# Patient Record
Sex: Male | Born: 1955 | Race: Black or African American | Hispanic: No | Marital: Married | Smoking: Never smoker
Health system: Southern US, Community
[De-identification: ages and names within clinical notes are randomized; demographics above are authoritative.]

## PROBLEM LIST (undated history)

## (undated) DIAGNOSIS — E785 Hyperlipidemia, unspecified: Secondary | ICD-10-CM

## (undated) DIAGNOSIS — E119 Type 2 diabetes mellitus without complications: Secondary | ICD-10-CM

## (undated) DIAGNOSIS — N189 Chronic kidney disease, unspecified: Secondary | ICD-10-CM

## (undated) DIAGNOSIS — I10 Essential (primary) hypertension: Secondary | ICD-10-CM

## (undated) HISTORY — DX: Chronic kidney disease, unspecified: N18.9

## (undated) HISTORY — DX: Essential (primary) hypertension: I10

## (undated) HISTORY — PX: COLONOSCOPY: SHX174

## (undated) HISTORY — PX: SEPTOPLASTY: SUR1290

## (undated) HISTORY — DX: Hyperlipidemia, unspecified: E78.5

---
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
## 2004-06-09 ENCOUNTER — Emergency Department (HOSPITAL_COMMUNITY): Admission: EM | Admit: 2004-06-09 | Discharge: 2004-06-10 | Payer: Self-pay | Admitting: Emergency Medicine

## 2005-03-31 ENCOUNTER — Encounter: Admission: RE | Admit: 2005-03-31 | Discharge: 2005-03-31 | Payer: Self-pay | Admitting: Emergency Medicine

## 2005-07-13 ENCOUNTER — Encounter: Admission: RE | Admit: 2005-07-13 | Discharge: 2005-07-13 | Payer: Self-pay | Admitting: Emergency Medicine

## 2005-08-19 ENCOUNTER — Encounter: Admission: RE | Admit: 2005-08-19 | Discharge: 2005-08-19 | Payer: Self-pay | Admitting: Emergency Medicine

## 2010-05-05 ENCOUNTER — Ambulatory Visit: Payer: Self-pay | Admitting: Cardiovascular Disease

## 2010-10-18 ENCOUNTER — Other Ambulatory Visit: Payer: Self-pay | Admitting: *Deleted

## 2010-10-18 DIAGNOSIS — E7800 Pure hypercholesterolemia, unspecified: Secondary | ICD-10-CM

## 2010-10-28 ENCOUNTER — Encounter: Payer: Self-pay | Admitting: Cardiovascular Disease

## 2010-10-28 DIAGNOSIS — E785 Hyperlipidemia, unspecified: Secondary | ICD-10-CM | POA: Insufficient documentation

## 2010-10-28 DIAGNOSIS — I10 Essential (primary) hypertension: Secondary | ICD-10-CM | POA: Insufficient documentation

## 2010-11-03 ENCOUNTER — Encounter: Payer: Self-pay | Admitting: Cardiovascular Disease

## 2010-11-03 ENCOUNTER — Other Ambulatory Visit (INDEPENDENT_AMBULATORY_CARE_PROVIDER_SITE_OTHER): Payer: BC Managed Care – PPO | Admitting: *Deleted

## 2010-11-03 ENCOUNTER — Ambulatory Visit (INDEPENDENT_AMBULATORY_CARE_PROVIDER_SITE_OTHER): Payer: BC Managed Care – PPO | Admitting: Cardiovascular Disease

## 2010-11-03 VITALS — BP 128/84 | HR 80 | Wt 229.0 lb

## 2010-11-03 DIAGNOSIS — E785 Hyperlipidemia, unspecified: Secondary | ICD-10-CM

## 2010-11-03 DIAGNOSIS — I10 Essential (primary) hypertension: Secondary | ICD-10-CM

## 2010-11-03 DIAGNOSIS — E7800 Pure hypercholesterolemia, unspecified: Secondary | ICD-10-CM

## 2010-11-03 LAB — HEPATIC FUNCTION PANEL
ALT: 38 U/L (ref 0–53)
Alkaline Phosphatase: 40 U/L (ref 39–117)
Bilirubin, Direct: 0.1 mg/dL (ref 0.0–0.3)
Total Bilirubin: 0.5 mg/dL (ref 0.3–1.2)
Total Protein: 6.7 g/dL (ref 6.0–8.3)

## 2010-11-03 LAB — BASIC METABOLIC PANEL
BUN: 15 mg/dL (ref 6–23)
CO2: 27 mEq/L (ref 19–32)
Calcium: 9.8 mg/dL (ref 8.4–10.5)
Chloride: 101 mEq/L (ref 96–112)
Creatinine, Ser: 1.4 mg/dL (ref 0.4–1.5)
GFR: 68.74 mL/min (ref >60.00)
Glucose, Bld: 140 mg/dL — ABNORMAL HIGH (ref 70–99)
Potassium: 4.6 mEq/L (ref 3.5–5.1)
Sodium: 135 mEq/L (ref 135–145)

## 2010-11-03 LAB — LIPID PANEL
Cholesterol: 117 mg/dL (ref 0–200)
HDL: 44.9 mg/dL
LDL Cholesterol: 52 mg/dL (ref 0–99)
Total CHOL/HDL Ratio: 3
Triglycerides: 103 mg/dL (ref 0.0–149.0)
VLDL: 20.6 mg/dL (ref 0.0–40.0)

## 2010-11-03 NOTE — Assessment & Plan Note (Signed)
His blood pressure is fairly well-controlled. Continue with his same medications. I've encouraged him to exercise and to try to lose weight.

## 2010-11-03 NOTE — Progress Notes (Signed)
Baird Cancer Date of Birth  1955-09-07 [facility] / [facility] [address]     [address] [place], [place]  [address] [phone]  Fax  [phone]  History of Present Illness:  Cole Travis is a middle aged man with a history of hypertension.  He has done very well recently.  He has put on a bit of weight over the past several months.  He denies any chest pain or dyspnea.    Current Outpatient Prescriptions on File Prior to Visit  Medication Sig Dispense Refill  . amLODipine (NORVASC) 10 MG tablet Take 10 mg by mouth daily.        Marland Kitchen aspirin 81 MG tablet Take 81 mg by mouth daily.        Marland Kitchen atorvastatin (LIPITOR) 20 MG tablet Take 20 mg by mouth daily.        . carvedilol (COREG) 25 MG tablet Take 25 mg by mouth 2 (two) times daily with a meal.        . Cetirizine HCl (ZYRTEC PO) Take by mouth as needed.        . Cholecalciferol (VITAMIN D) 1000 UNITS capsule Take 1,000 Units by mouth 2 (two) times daily.       Marland Kitchen doxazosin (CARDURA) 4 MG tablet Take 4 mg by mouth at bedtime.        Marland Kitchen losartan (COZAAR) 100 MG tablet Take 100 mg by mouth daily.        . metFORMIN (GLUCOPHAGE) 500 MG tablet Take 500 mg by mouth 2 (two) times daily with a meal.        . Multiple Vitamin (MULTIVITAMIN) tablet Take 1 tablet by mouth daily.        Marland Kitchen spironolactone-hydrochlorothiazide (ALDACTAZIDE) 25-25 MG per tablet Take 1 tablet by mouth daily.        . meclizine (ANTIVERT) 25 MG tablet Take 25 mg by mouth 3 (three) times daily as needed.        Marland Kitchen DISCONTD: potassium chloride 20 MEQ/15ML (10%) solution Take 20 mEq by mouth 3 (three) times daily.         Allergies  Allergen Reactions  . Lotrel   . Tetracyclines & Related     Past Medical History  Diagnosis Date  . Hyperlipidemia   . Hypertension     Past Surgical History  Procedure Date  . Breast lumpectomy 1976    LEFT BREAST    History  Smoking status  . Never Smoker   Smokeless tobacco  . Not on file     History  Alcohol Use No    Family History  Problem Relation Age of Onset  . Hypertension Mother   . Hypertension Father     Reviw of Systems:  Reviewed in the HPI.  All other systems are negative.  Physical Exam: BP 128/84  Pulse 80  Wt 229 lb (103.874 kg) The patient is alert and oriented x 3.  The mood and affect are normal.  The skin is warm and dry.  Color is normal.  The HEENT exam reveals that the sclera are nonicteric.  The mucous membranes are moist.  The carotids are 2+ without bruits.  There is no thyromegaly.  There is no JVD.  The lungs are clear.  The chest wall is non tender.  The heart exam reveals a regular rate with a normal S1 and S2.  There are no murmurs, gallops, or rubs.  The PMI is not displaced.  Abdominal exam reveals good bowel sounds.  There is no guarding or rebound.  There is no hepatosplenomegaly or tenderness.  There are no masses.  Exam of the legs reveal no clubbing, cyanosis, or edema.  The legs are without rashes.  The distal pulses are intact.  Cranial nerves II - XII are intact.  Motor and sensory functions are intact.  The gait is normal.  ECG: NSR.  No ST or T wave changes.  Assessment / Plan:

## 2010-11-05 ENCOUNTER — Telehealth: Payer: Self-pay | Admitting: *Deleted

## 2010-11-05 NOTE — Telephone Encounter (Signed)
Message copied by Mahalia Longest on Fri Nov 05, 2010  3:29 PM ------      Message from: [place], [place]      Created: Thu Nov 04, 2010  1:43 PM       Looks good

## 2010-11-05 NOTE — Telephone Encounter (Signed)
Patient called with lab results, normal result  msg left. Alfonso Ramus RN

## 2011-01-13 ENCOUNTER — Other Ambulatory Visit: Payer: Self-pay | Admitting: Cardiovascular Disease

## 2011-05-05 ENCOUNTER — Other Ambulatory Visit: Payer: Self-pay | Admitting: Emergency Medicine

## 2011-05-05 DIAGNOSIS — N63 Unspecified lump in unspecified breast: Secondary | ICD-10-CM

## 2011-05-23 ENCOUNTER — Ambulatory Visit
Admission: RE | Admit: 2011-05-23 | Discharge: 2011-05-23 | Disposition: A | Discharge status: Home / Self Care | Payer: BC Managed Care – PPO | Source: Ambulatory Visit | Attending: Emergency Medicine | Admitting: Emergency Medicine

## 2011-05-23 DIAGNOSIS — N63 Unspecified lump in unspecified breast: Secondary | ICD-10-CM

## 2011-05-31 ENCOUNTER — Other Ambulatory Visit: Payer: Self-pay | Admitting: Cardiovascular Disease

## 2011-07-21 ENCOUNTER — Other Ambulatory Visit: Payer: Self-pay | Admitting: *Deleted

## 2011-07-21 MED ORDER — LOSARTAN POTASSIUM 100 MG PO TABS: 100.0000 mg | ORAL_TABLET | Freq: Every day | ORAL | Status: DC

## 2011-07-21 NOTE — Telephone Encounter (Signed)
Fax Received. Refill Completed. Cole Travis (R.M.A)   

## 2011-11-24 ENCOUNTER — Other Ambulatory Visit: Payer: Self-pay | Admitting: Cardiovascular Disease

## 2011-11-24 MED ORDER — LOSARTAN POTASSIUM 100 MG PO TABS: 100.0000 mg | ORAL_TABLET | Freq: Every day | ORAL | Status: AC

## 2012-01-23 ENCOUNTER — Other Ambulatory Visit: Payer: Self-pay | Admitting: Cardiovascular Disease

## 2012-01-23 MED ORDER — ATORVASTATIN CALCIUM 20 MG PO TABS: 20.0000 mg | ORAL_TABLET | Freq: Every day | ORAL | Status: DC

## 2013-03-26 ENCOUNTER — Encounter: Payer: Self-pay | Admitting: Cardiovascular Disease

## 2014-01-08 ENCOUNTER — Other Ambulatory Visit: Payer: Self-pay | Admitting: Family Medicine

## 2014-01-08 ENCOUNTER — Ambulatory Visit
Admission: RE | Admit: 2014-01-08 | Discharge: 2014-01-08 | Disposition: A | Discharge status: Home / Self Care | Payer: BC Managed Care – PPO | Source: Ambulatory Visit | Attending: Family Medicine | Admitting: Family Medicine

## 2014-01-08 DIAGNOSIS — M542 Cervicalgia: Secondary | ICD-10-CM

## 2015-12-18 ENCOUNTER — Ambulatory Visit
Admission: RE | Admit: 2015-12-18 | Discharge: 2015-12-18 | Disposition: A | Discharge status: Home / Self Care | Payer: BC Managed Care – PPO | Source: Ambulatory Visit | Attending: Family Medicine | Admitting: Family Medicine

## 2015-12-18 ENCOUNTER — Other Ambulatory Visit: Payer: Self-pay | Admitting: Family Medicine

## 2015-12-18 DIAGNOSIS — M542 Cervicalgia: Secondary | ICD-10-CM

## 2016-05-31 ENCOUNTER — Other Ambulatory Visit: Payer: Self-pay | Admitting: Family Medicine

## 2016-05-31 DIAGNOSIS — R52 Pain, unspecified: Secondary | ICD-10-CM

## 2016-06-02 ENCOUNTER — Other Ambulatory Visit: Payer: Self-pay | Admitting: Family Medicine

## 2016-06-02 DIAGNOSIS — R52 Pain, unspecified: Secondary | ICD-10-CM

## 2016-06-02 DIAGNOSIS — R202 Paresthesia of skin: Secondary | ICD-10-CM

## 2016-06-11 ENCOUNTER — Ambulatory Visit
Admission: RE | Admit: 2016-06-11 | Discharge: 2016-06-11 | Disposition: A | Discharge status: Home / Self Care | Payer: BC Managed Care – PPO | Source: Ambulatory Visit | Attending: Family Medicine | Admitting: Family Medicine

## 2016-06-11 DIAGNOSIS — R202 Paresthesia of skin: Secondary | ICD-10-CM

## 2016-06-11 DIAGNOSIS — R52 Pain, unspecified: Secondary | ICD-10-CM

## 2016-06-11 MED ORDER — GADOBENATE DIMEGLUMINE 529 MG/ML IV SOLN
20.0000 mL | Freq: Once | INTRAVENOUS | Status: AC | PRN
  Administered 2016-06-11: 20 mL via INTRAVENOUS

## 2016-12-16 ENCOUNTER — Ambulatory Visit
Admission: RE | Admit: 2016-12-16 | Discharge: 2016-12-16 | Disposition: A | Discharge status: Home / Self Care | Payer: BC Managed Care – PPO | Source: Ambulatory Visit | Attending: Family Medicine | Admitting: Family Medicine

## 2016-12-16 ENCOUNTER — Other Ambulatory Visit: Payer: Self-pay | Admitting: Family Medicine

## 2016-12-16 DIAGNOSIS — R069 Unspecified abnormalities of breathing: Secondary | ICD-10-CM

## 2019-06-16 ENCOUNTER — Emergency Department
Admission: EM | Admit: 2019-06-16 | Discharge: 2019-06-16 | Disposition: A | Discharge status: Home / Self Care | Payer: BC Managed Care – PPO | Attending: Emergency Medicine | Admitting: Emergency Medicine

## 2019-06-16 ENCOUNTER — Other Ambulatory Visit: Payer: Self-pay

## 2019-06-16 ENCOUNTER — Encounter: Payer: Self-pay | Admitting: Emergency Medicine

## 2019-06-16 DIAGNOSIS — Z79899 Other long term (current) drug therapy: Secondary | ICD-10-CM | POA: Insufficient documentation

## 2019-06-16 DIAGNOSIS — Z7982 Long term (current) use of aspirin: Secondary | ICD-10-CM | POA: Insufficient documentation

## 2019-06-16 DIAGNOSIS — I10 Essential (primary) hypertension: Secondary | ICD-10-CM | POA: Insufficient documentation

## 2019-06-16 DIAGNOSIS — R0981 Nasal congestion: Secondary | ICD-10-CM | POA: Diagnosis present

## 2019-06-16 DIAGNOSIS — E119 Type 2 diabetes mellitus without complications: Secondary | ICD-10-CM | POA: Insufficient documentation

## 2019-06-16 DIAGNOSIS — U071 COVID-19: Secondary | ICD-10-CM

## 2019-06-16 DIAGNOSIS — Z7984 Long term (current) use of oral hypoglycemic drugs: Secondary | ICD-10-CM | POA: Insufficient documentation

## 2019-06-16 HISTORY — DX: Type 2 diabetes mellitus without complications: E11.9

## 2019-06-16 LAB — POC SARS CORONAVIRUS 2 AG: SARS Coronavirus 2 Ag: POSITIVE — AB

## 2019-06-16 MED ORDER — AZITHROMYCIN 250 MG PO TABS: ORAL_TABLET | ORAL | 0 refills | Status: DC

## 2019-06-16 NOTE — ED Provider Notes (Signed)
[facility] Emergency Department Provider Note  ____________________________________________   First MD Initiated Contact with Patient 06/16/19 1359     (approximate)  I have reviewed the triage vital signs and the nursing notes.   HISTORY  Chief Complaint Nasal Congestion    HPI Cole Travis is a 63 y.o. male presents emergency department complaint of nasal congestion, loss of taste, and decreased sense of smell.  Wife tested positive for Covid on December 23.  He states he has had symptoms.  Minimal cough if any.  No vomiting or diarrhea.  No chest pain or shortness of breath.    Past Medical History:  Diagnosis Date  . Diabetes mellitus without complication ([place])   . Hyperlipidemia   . Hypertension     Patient Active Problem List   Diagnosis Date Noted  . Hyperlipidemia   . Hypertension     Past Surgical History:  Procedure Laterality Date  . BREAST LUMPECTOMY  1976   LEFT BREAST    Prior to Admission medications   Medication Sig Start Date End Date Taking? Authorizing Provider  amLODipine (NORVASC) 10 MG tablet Take 10 mg by mouth daily.      [provider]  aspirin 81 MG tablet Take 81 mg by mouth daily.      [provider]  atorvastatin (LIPITOR) 20 MG tablet Take 1 tablet (20 mg total) by mouth daily. 01/23/12   Nahser, Wonda Cheng, MD  azithromycin (ZITHROMAX Z-PAK) 250 MG tablet 2 pills today then 1 pill a day for 4 days 06/16/19   Caryn Section Linden Dolin, PA-C  carvedilol (COREG) 25 MG tablet TAKE 1 TABLET BY MOUTH TWICE DAILY 05/31/11   Nahser, Wonda Cheng, MD  Cetirizine HCl (ZYRTEC PO) Take by mouth as needed.      [provider]  Cholecalciferol (VITAMIN D) 1000 UNITS capsule Take 1,000 Units by mouth 2 (two) times daily.     [provider]  Cyanocobalamin (B-12) 1000 MCG CAPS Take 1 capsule by mouth daily.      [provider]  doxazosin (CARDURA) 4 MG tablet Take 4 mg by mouth at bedtime.       [provider]  losartan (COZAAR) 100 MG tablet Take 1 tablet (100 mg total) by mouth daily. 11/24/11   Nahser, Wonda Cheng, MD  meclizine (ANTIVERT) 25 MG tablet Take 25 mg by mouth 3 (three) times daily as needed.      [provider]  metFORMIN (GLUCOPHAGE) 500 MG tablet Take 500 mg by mouth 2 (two) times daily with a meal.      [provider]  Multiple Vitamin (MULTIVITAMIN) tablet Take 1 tablet by mouth daily.      [provider]  Omega-3 Fatty Acids (FISH OIL) 1200 MG CAPS Take 1 capsule by mouth daily.      [provider]  POTASSIUM CHLORIDE PO Take 20 mg by mouth 3 (three) times daily.      [provider]  spironolactone-hydrochlorothiazide (ALDACTAZIDE) 25-25 MG per tablet Take 1 tablet by mouth daily.      [provider]    Allergies Amlodipine besy-benazepril hcl and Tetracyclines & related  Family History  Problem Relation Age of Onset  . Hypertension Mother   . Hypertension Father     Social History Social History   Tobacco Use  . Smoking status: Never Smoker  . Smokeless tobacco: Never Used  Substance Use Topics  . Alcohol use: No  .  Drug use: No    Review of Systems  Constitutional: No fever/chills Eyes: No visual changes. ENT: No sore throat.  Loss of sense of taste of smell Respiratory: Denies cough Genitourinary: Negative for dysuria. Musculoskeletal: Negative for back pain. Skin: Negative for rash.    ____________________________________________   PHYSICAL EXAM:  VITAL SIGNS: ED Triage Vitals  Enc Vitals Group     BP 06/16/19 1344 (!) 149/84     Pulse Rate 06/16/19 1344 67     Resp 06/16/19 1344 16     Temp 06/16/19 1344 99.3 F (37.4 C)     Temp Source 06/16/19 1344 Oral     SpO2 06/16/19 1344 99 %     Weight 06/16/19 1345 216 lb (98 kg)     Height 06/16/19 1345 6' (1.829 m)     Head Circumference --      Peak Flow --      Pain Score 06/16/19 1345 0     Pain Loc --       Pain Edu? --      Excl. in GC? --     Constitutional: Alert and oriented. Well appearing and in no acute distress. Eyes: Conjunctivae are normal.  Head: Atraumatic. Nose: No congestion/rhinnorhea. Mouth/Throat: Mucous membranes are moist.   Neck:  supple no lymphadenopathy noted Cardiovascular: Normal rate, regular rhythm. Heart sounds are normal Respiratory: Normal respiratory effort.  No retractions, lungs c t a  GU: deferred Musculoskeletal: FROM all extremities, warm and well perfused Neurologic:  Normal speech and language.  Skin:  Skin is warm, dry and intact. No rash noted. Psychiatric: Mood and affect are normal. Speech and behavior are normal.  ____________________________________________   LABS (all labs ordered are listed, but only abnormal results are displayed)  Labs Reviewed  POC SARS CORONAVIRUS 2 AG - Abnormal; Notable for the following components:      Result Value   SARS Coronavirus 2 Ag POSITIVE (*)    All other components within normal limits  POC SARS CORONAVIRUS 2 AG -  ED   ____________________________________________   ____________________________________________  RADIOLOGY    ____________________________________________   PROCEDURES  Procedure(s) performed: No  Procedures    ____________________________________________   INITIAL IMPRESSION / ASSESSMENT AND PLAN / ED COURSE  Pertinent labs & imaging results that were available during my care of the patient were reviewed by me and considered in my medical decision making (see chart for details).   Patient is a 63 year old male presents emergency department Covid-like symptoms.  See HPI  Physical exam shows patient to appear well.  Vitals are normal.  POC Covid test is positive  Explained the findings to the patient.  He is to follow-up with his regular doctor as needed.  Is given prescription for Z-Pak.  Return emergency department if he begins to have shortness of breath or  difficulty breathing.  He states he understands will comply.  Is discharged stable condition.    Cole Travis was evaluated in Emergency Department on 06/16/2019 for the symptoms described in the history of present illness. He was evaluated in the context of the global COVID-19 pandemic, which necessitated consideration that the patient might be at risk for infection with the SARS-CoV-2 virus that causes COVID-19. Institutional protocols and algorithms that pertain to the evaluation of patients at risk for COVID-19 are in a state of rapid change based on information released by regulatory bodies including the CDC and federal and state organizations. These policies and algorithms were followed during  the patient's care in the ED.   As part of my medical decision making, I reviewed the following data within the electronic MEDICAL RECORD NUMBER Nursing notes reviewed and incorporated, Labs reviewed Covid positive, Old chart reviewed, Notes from prior ED visits and [place] Controlled Substance Database  ____________________________________________   FINAL CLINICAL IMPRESSION(S) / ED DIAGNOSES  Final diagnoses:  COVID-19      NEW MEDICATIONS STARTED DURING THIS VISIT:  New Prescriptions   AZITHROMYCIN (ZITHROMAX Z-PAK) 250 MG TABLET    2 pills today then 1 pill a day for 4 days     Note:  This document was prepared using Dragon voice recognition software and may include unintentional dictation errors.    Faythe Ghee, PA-C 06/16/19 1439    Minna Antis, MD 06/16/19 [phone]

## 2019-06-16 NOTE — Discharge Instructions (Addendum)
Follow-up with your regular doctor as needed.  Return emergency department if worsening. 

## 2019-06-16 NOTE — ED Triage Notes (Signed)
Patient presents to the ED with nasal congestion and loss of taste.  Patient's wife recently tested positive for covid19 and patient has been around her significantly.  Patient states, "I just wanted to get checked out."  Patient is in no obvious distress at this time.

## 2020-12-30 ENCOUNTER — Ambulatory Visit
Admission: RE | Admit: 2020-12-30 | Discharge: 2020-12-30 | Disposition: A | Discharge status: Home / Self Care | Payer: Medicare PPO | Source: Ambulatory Visit | Attending: Family Medicine | Admitting: Family Medicine

## 2020-12-30 ENCOUNTER — Other Ambulatory Visit: Payer: Self-pay | Admitting: Family Medicine

## 2020-12-30 DIAGNOSIS — M79605 Pain in left leg: Secondary | ICD-10-CM

## 2022-09-24 IMAGING — CR DG LUMBAR SPINE COMPLETE 4+V
5 series · 5 of 5 positions shown · non-contrast
Comparison: None.

CLINICAL DATA: Chronic left-sided low back pain radiating into the
left hip and thigh. No injury or prior surgery.

EXAM:
LUMBAR SPINE - COMPLETE 4+ VIEW

[t lumbar spine ap]
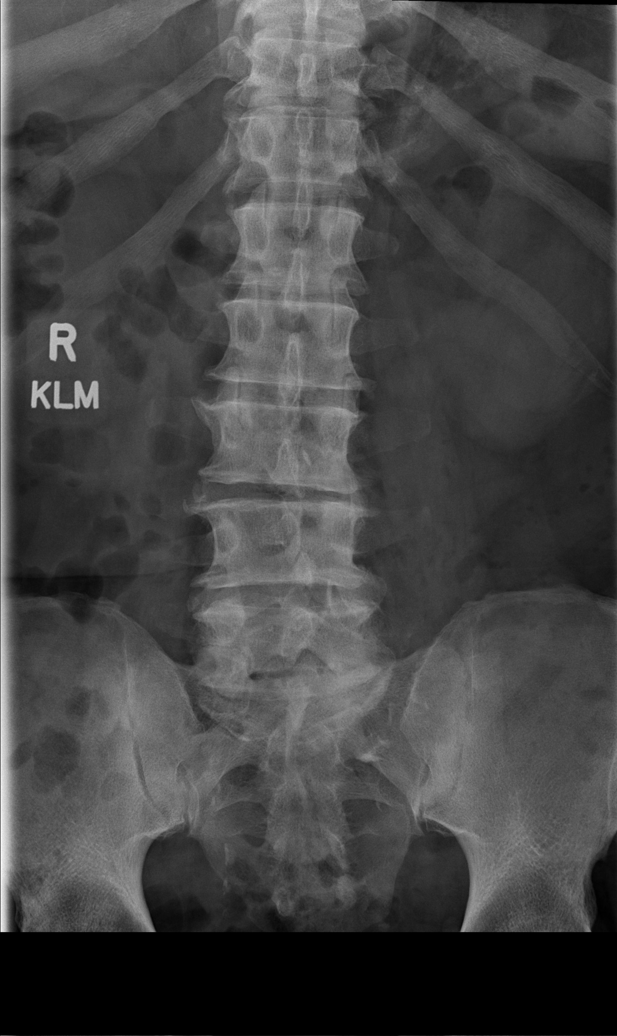

[t lumbar spine obl (1 of 2)]
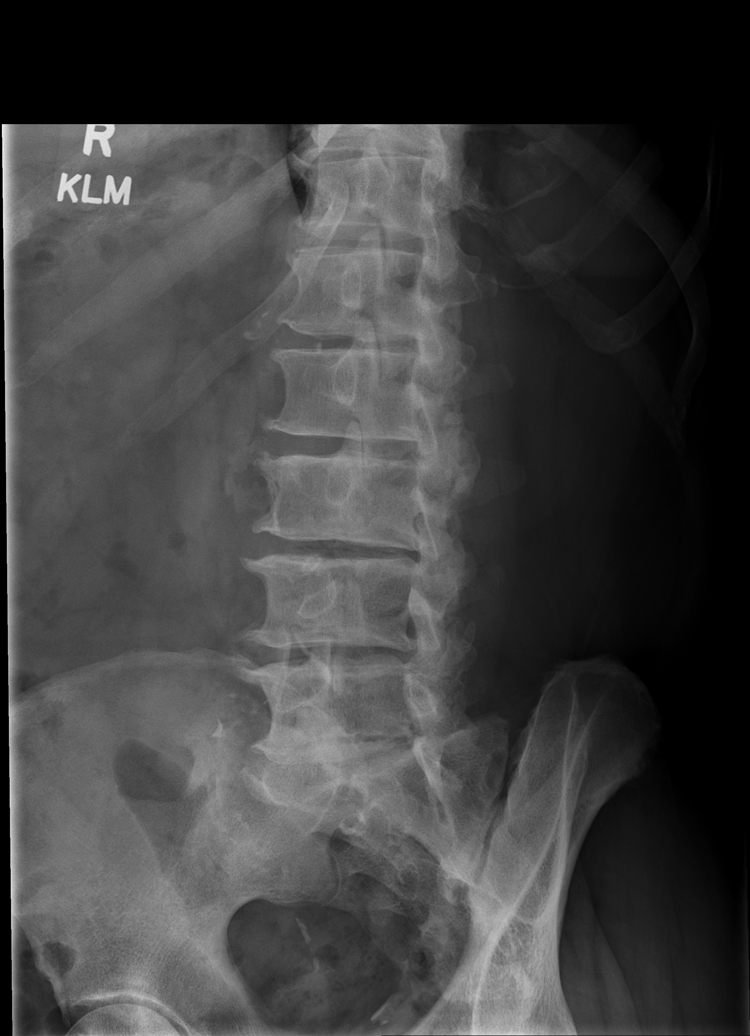

[t lumbar spine obl (2 of 2)]
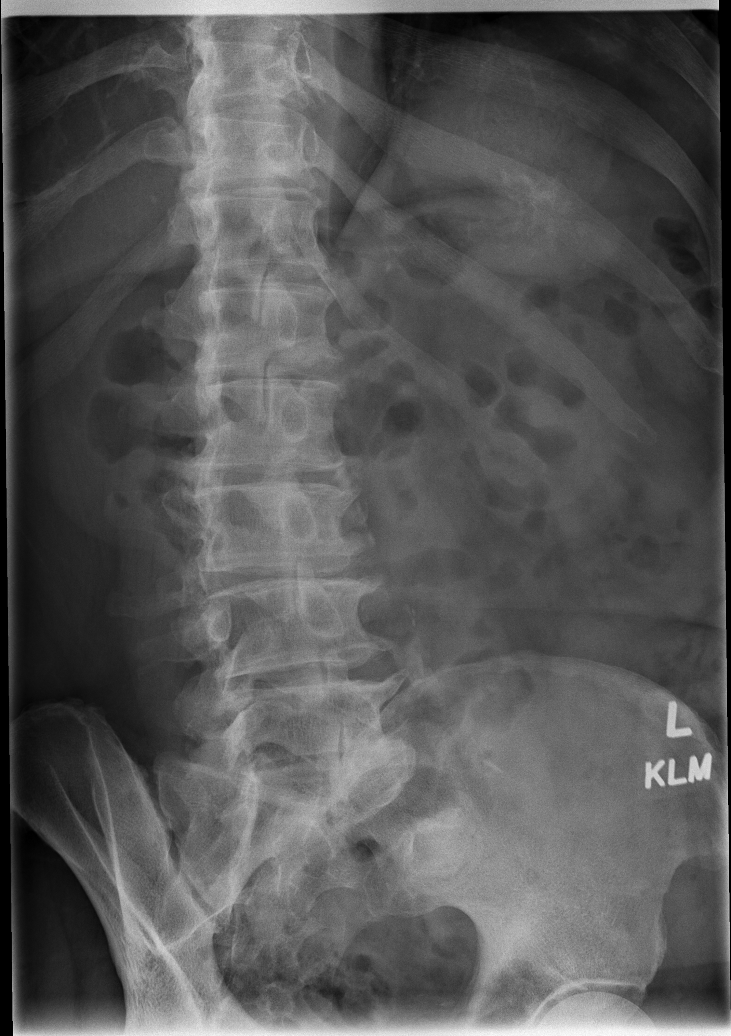

[t lumbar spine lat]
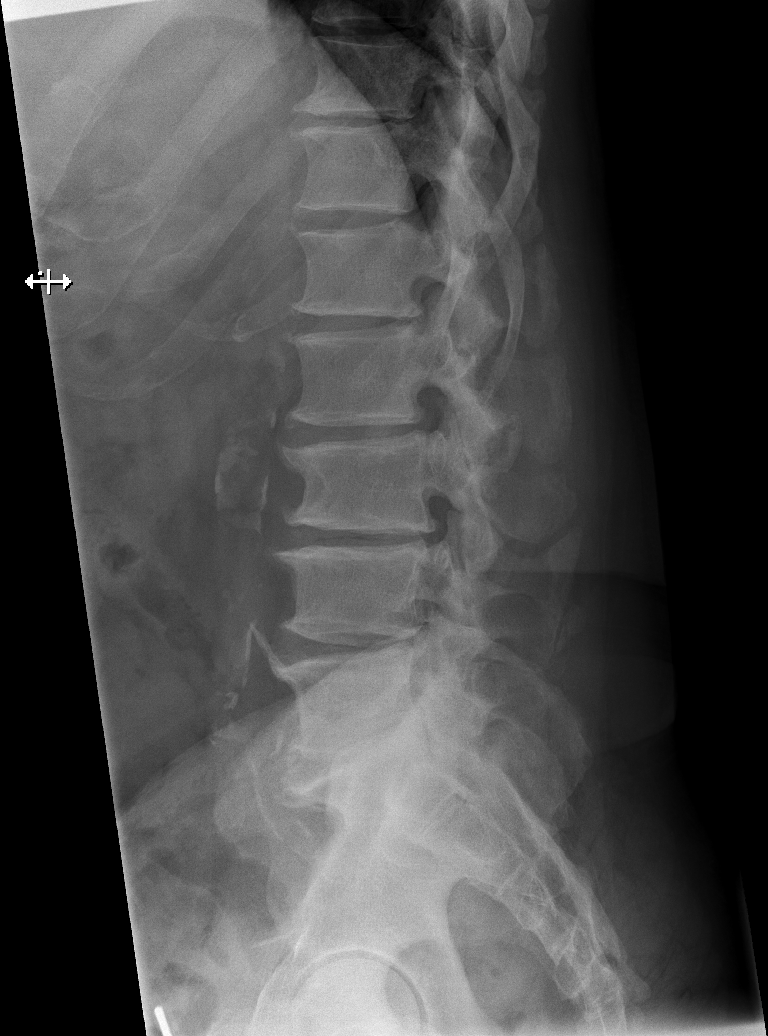

[t lumbar l-5 s-1 spot]
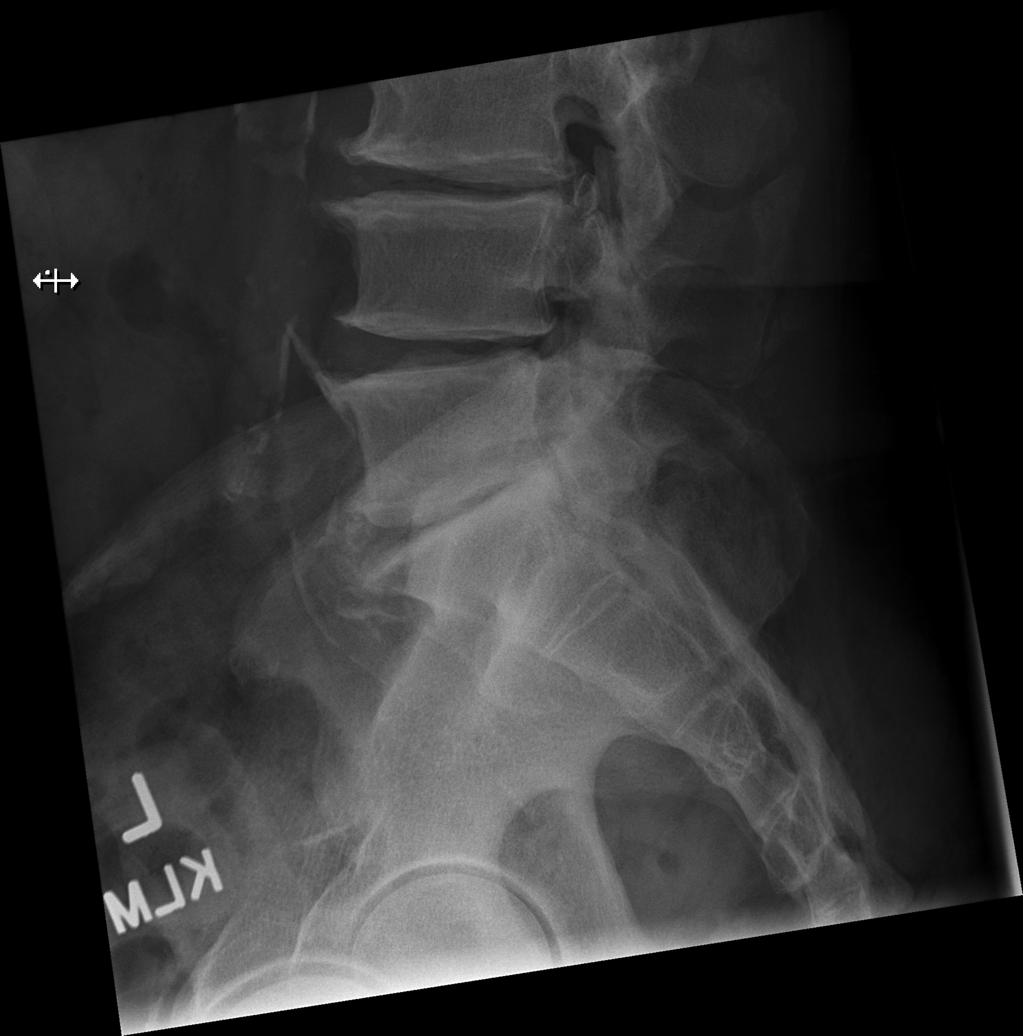

[5 of 5 positions shown; findings below may reference images not displayed]

FINDINGS: Five lumbar type vertebral bodies.

No acute fracture or subluxation. Vertebral body heights are
preserved.

Straightening of the normal lumbar lordosis. Trace retrolisthesis at
L3-L4 and L4-L5.

Diffuse disc height loss throughout the lumbar spine, severe at
L5-S1.

The sacroiliac joints are unremarkable. Aortoiliac atherosclerotic
vascular disease.
IMPRESSION: 1. Multilevel lumbar spondylosis, severe at L5-S1.

## 2023-03-14 ENCOUNTER — Telehealth: Payer: Self-pay | Admitting: Gastroenterology

## 2023-03-14 NOTE — Telephone Encounter (Signed)
Patient is being referred to [place] for a colonoscopy. He is a previous patient of Dr. Kinnie Scales and no longer going to [facility] because of him retiring. Records request was sent to acquire his pathology reports. Advised that once records received we would reach out to further assist.

## 2023-03-23 ENCOUNTER — Telehealth: Payer: Self-pay | Admitting: Gastroenterology

## 2023-03-23 NOTE — Telephone Encounter (Signed)
Good morning Dr. Meridee Score,   We received a referral fro this patient to be scheduled for a colonoscopy. Patient last had colonoscopy with Dr. Kinnie Scales at [facility] in 11/2017. Dr. Kinnie Scales has since retired and patient would like to get established with you. Records were obtained and scanned into Media for your review. Would you please advise on scheduling?   Thank you.

## 2023-03-24 NOTE — Telephone Encounter (Signed)
Patient's chart has been reviewed. He had colon polyps in 2009 and 2014 with no polyps in 2019. He was recommended a 5-year colonoscopy by his last gastroenterologist. He is excepted to the [facility] gastroenterology service. Thing is reasonable for [place] to move forward with scheduling him colonoscopy. Next available with me in the [facility]. We will bring him up to current guidelines based on what we find at the time of his procedure. Thanks. GM

## 2023-03-29 ENCOUNTER — Encounter: Payer: Self-pay | Admitting: Gastroenterology

## 2023-03-29 NOTE — Telephone Encounter (Signed)
Left voicemail for patient to call back and discuss colonoscopy.

## 2023-05-15 ENCOUNTER — Ambulatory Visit (AMBULATORY_SURGERY_CENTER): Payer: Medicare PPO | Admitting: *Deleted

## 2023-05-15 VITALS — Ht 70.0 in | Wt 216.0 lb

## 2023-05-15 DIAGNOSIS — Z860100 Personal history of colon polyps, unspecified: Secondary | ICD-10-CM

## 2023-05-15 MED ORDER — NA SULFATE-K SULFATE-MG SULF 17.5-3.13-1.6 GM/177ML PO SOLN: 1.0000 | Freq: Once | ORAL | 0 refills | Status: AC

## 2023-05-15 NOTE — Progress Notes (Signed)
Pt's name and DOB verified at the beginning of the pre-visit wit 2 identifiers  Pt denies any difficulty with ambulating,sitting, laying down or rolling side to side  Pt has issues with ambulation   Pt has no issues moving head neck or swallowing  No egg or soy allergy known to patient   No issues known to pt with past sedation with any surgeries or procedures  No FH of Malignant Hyperthermia  Pt is not on diet pills or shots  Pt is not on home 02   Pt is not on blood thinners   Pt denies issues with constipation   Pt is not on dialysis  Pt denise any abnormal heart rhythms   Pt denies any upcoming cardiac testing  Pt encouraged to use to use Singlecare or Goodrx to reduce cost   Patient's chart reviewed by Cathlyn Parsons CNRA prior to pre-visit and patient appropriate for the [facility].  Pre-visit completed and red dot placed by patient's name on their procedure day (on provider's schedule).  .  Visit by phone  Pt states weight is 216 lb  Instructed pt why it is important to and  to call if they have any changes in health or new medications. Directed them to the # given and on instructions.     Instructions reviewed. Pt given both [facility] main # and MD on call # prior to instructions.  Pt states understanding. Instructed to review again prior to procedure. Pt states they will.   Instructions sent by mail with coupon and by My Chart  Coupon sent via text to mobile phone and pt verified they received it

## 2023-06-09 ENCOUNTER — Ambulatory Visit: Payer: Medicare PPO | Admitting: Gastroenterology

## 2023-06-09 ENCOUNTER — Encounter: Payer: Self-pay | Admitting: Gastroenterology

## 2023-06-09 VITALS — BP 117/62 | HR 63 | Temp 97.5°F | Resp 15 | Ht 70.0 in | Wt 216.0 lb

## 2023-06-09 DIAGNOSIS — K5730 Diverticulosis of large intestine without perforation or abscess without bleeding: Secondary | ICD-10-CM

## 2023-06-09 DIAGNOSIS — D124 Benign neoplasm of descending colon: Secondary | ICD-10-CM | POA: Diagnosis not present

## 2023-06-09 DIAGNOSIS — Z860100 Personal history of colon polyps, unspecified: Secondary | ICD-10-CM

## 2023-06-09 DIAGNOSIS — D122 Benign neoplasm of ascending colon: Secondary | ICD-10-CM | POA: Diagnosis not present

## 2023-06-09 DIAGNOSIS — D123 Benign neoplasm of transverse colon: Secondary | ICD-10-CM

## 2023-06-09 DIAGNOSIS — D120 Benign neoplasm of cecum: Secondary | ICD-10-CM | POA: Diagnosis not present

## 2023-06-09 DIAGNOSIS — Z1211 Encounter for screening for malignant neoplasm of colon: Secondary | ICD-10-CM | POA: Diagnosis not present

## 2023-06-09 DIAGNOSIS — K641 Second degree hemorrhoids: Secondary | ICD-10-CM

## 2023-06-09 DIAGNOSIS — K648 Other hemorrhoids: Secondary | ICD-10-CM

## 2023-06-09 MED ORDER — SODIUM CHLORIDE 0.9 % IV SOLN: 500.0000 mL | Freq: Once | INTRAVENOUS | Status: DC

## 2023-06-09 NOTE — Op Note (Signed)
[facility] Patient Name: Cole Travis Procedure Date: 06/09/2023 11:28 AM MRN: [id number] Endoscopist: Doristine Locks , MD, [id number] Age: 67 Referring MD:  Date of Birth: 12-Apr-1956 Gender: Male Account #: [id number] Procedure:                Colonoscopy Indications:              Surveillance: Personal history of adenomatous                            polyps on last colonoscopy 5 years ago                           He had colon polyps in 2009 and 2014 with no polyps                            in 2019.                           He was recommended a 5-year colonoscopy due to                            history of colon polyps. Otherwise, no recent GI                            symptoms and no FHx of colon cancer. Medicines:                Monitored Anesthesia Care Procedure:                Pre-Anesthesia Assessment:                           - Prior to the procedure, a History and Physical                            was performed, and patient medications and                            allergies were reviewed. The patient's tolerance of                            previous anesthesia was also reviewed. The risks                            and benefits of the procedure and the sedation                            options and risks were discussed with the patient.                            All questions were answered, and informed consent                            was obtained. Prior Anticoagulants: The patient has  taken no anticoagulant or antiplatelet agents. ASA                            Grade Assessment: II - A patient with mild systemic                            disease. After reviewing the risks and benefits,                            the patient was deemed in satisfactory condition to                            undergo the procedure.                           After obtaining informed consent, the colonoscope                             was passed under direct vision. Throughout the                            procedure, the patient's blood pressure, pulse, and                            oxygen saturations were monitored continuously. The                            CF [id number] #[id number] was introduced through the anus                            and advanced to the the cecum, identified by                            appendiceal orifice and ileocecal valve. The                            colonoscopy was performed without difficulty. The                            patient tolerated the procedure well. The quality                            of the bowel preparation was good. The ileocecal                            valve, appendiceal orifice, and rectum were                            photographed. Scope In: 11:43:23 AM Scope Out: 12:01:14 PM Scope Withdrawal Time: 0 hours 14 minutes 58 seconds  Total Procedure Duration: 0 hours 17 minutes 51 seconds  Findings:                 The perianal and digital rectal examinations were  normal.                           Four sessile polyps were found in the transverse                            colon (2), ascending colon, and cecum. The polyps                            were 3 to 8 mm in size. These polyps were removed                            with a cold snare. Resection and retrieval were                            complete. Estimated blood loss was minimal.                           A 4 mm polyp was found in the descending colon. The                            polyp was sessile. The polyp was removed with a                            cold snare. Resection and retrieval were complete.                            Estimated blood loss was minimal.                           Multiple medium-mouthed and small-mouthed                            diverticula were found in the sigmoid colon.                           Non-bleeding internal hemorrhoids were found during                             retroflexion. The hemorrhoids were small. Complications:            No immediate complications. Estimated Blood Loss:     Estimated blood loss was minimal. Impression:               - Four 3 to 8 mm polyps in the transverse colon, in                            the ascending colon and in the cecum, removed with                            a cold snare. Resected and retrieved.                           - One 4 mm polyp in the  descending colon, removed                            with a cold snare. Resected and retrieved.                           - Diverticulosis in the sigmoid colon.                           - Non-bleeding internal hemorrhoids. Recommendation:           - Patient has a contact number available for                            emergencies. The signs and symptoms of potential                            delayed complications were discussed with the                            patient. Return to normal activities tomorrow.                            Written discharge instructions were provided to the                            patient.                           - Resume previous diet.                           - Continue present medications.                           - Await pathology results.                           - Repeat colonoscopy for surveillance based on                            pathology results.                           - Use fiber, for example Citrucel, Fibercon, Konsyl                            or Metamucil.                           - Internal hemorrhoids were noted on this study and                            may be amenable to hemorrhoid band ligation. If you                            are interested in further treatment of these  hemorrhoids with band ligation, please contact my                            clinic to set up an appointment for evaluation and                            treatment. Doristine Locks, MD 06/09/2023 12:06:43 PM

## 2023-06-09 NOTE — Progress Notes (Signed)
To pacu, VSS. Report to RN.tb 

## 2023-06-09 NOTE — Progress Notes (Signed)
Called to room to assist during endoscopic procedure.  Patient ID and intended procedure confirmed with present staff. Received instructions for my participation in the procedure from the performing physician.  

## 2023-06-09 NOTE — Patient Instructions (Addendum)
Use Fiber, for example Citrucel,Fibercon,Konsyl or Metamucil. Call GI clinic if interested in hemorrhoid band ligation.  YOU HAD AN ENDOSCOPIC PROCEDURE TODAY: Refer to the procedure report and other information in the discharge instructions given to you for any specific questions about what was found during the examination. If this information does not answer your questions, please call [place] office at [phone] to clarify.   YOU SHOULD EXPECT: Some feelings of bloating in the abdomen. Passage of more gas than usual. Walking can help get rid of the air that was put into your GI tract during the procedure and reduce the bloating. If you had a lower endoscopy (such as a colonoscopy or flexible sigmoidoscopy) you may notice spotting of blood in your stool or on the toilet paper. Some abdominal soreness may be present for a day or two, also.  DIET: Your first meal following the procedure should be a light meal and then it is ok to progress to your normal diet. A half-sandwich or bowl of soup is an example of a good first meal. Heavy or fried foods are harder to digest and may make you feel nauseous or bloated. Drink plenty of fluids but you should avoid alcoholic beverages for 24 hours. If you had a esophageal dilation, please see attached instructions for diet.    ACTIVITY: Your care partner should take you home directly after the procedure. You should plan to take it easy, moving slowly for the rest of the day. You can resume normal activity the day after the procedure however YOU SHOULD NOT DRIVE, use power tools, machinery or perform tasks that involve climbing or major physical exertion for 24 hours (because of the sedation medicines used during the test).   SYMPTOMS TO REPORT IMMEDIATELY: A gastroenterologist can be reached at any hour. Please call [phone]  for any of the following symptoms:  Following lower endoscopy (colonoscopy) Excessive amounts of blood in the stool  Significant  tenderness, worsening of abdominal pains  Swelling of the abdomen that is new, acute  Fever of 100 or higher  FOLLOW UP:  If any biopsies were taken you will be contacted by phone or by letter within the next 1-3 weeks. Call [phone]  if you have not heard about the biopsies in 3 weeks.  Please also call with any specific questions about appointments or follow up tests.

## 2023-06-09 NOTE — Progress Notes (Signed)
GASTROENTEROLOGY PROCEDURE H&P NOTE   Primary Care Physician: Mosetta Putt, MD    Reason for Procedure:  Colon polyp surveillance  Plan:    Colonoscopy  Patient is appropriate for endoscopic procedure(s) in the ambulatory ([facility]) setting.  The nature of the procedure, as well as the risks, benefits, and alternatives were carefully and thoroughly reviewed with the patient. Ample time for discussion and questions allowed. The patient understood, was satisfied, and agreed to proceed.     HPI: Cole Travis is a 67 y.o. male who presents for colonoscopy for ongoing colon polyp surveillance and colon cancer screening.  No active GI symptoms.  No known family history of colon cancer or related malignancy.  Patient is otherwise without complaints or active issues today.  Last colonoscopy was 2019 and normal, with recommendation to repeat in 5 years due to prior history of polyps.   All prior colonoscopies were at outside facility. He had colon polyps in 2009 and 2014 with no polyps in 2019. He was recommended a 5-year colonoscopy by his last gastroenterologist.  Past Medical History:  Diagnosis Date   Chronic kidney disease    stage 3 per pt   Diabetes mellitus without complication (HCC)    Hyperlipidemia    Hypertension     Past Surgical History:  Procedure Laterality Date   BREAST LUMPECTOMY  06/20/1974   LEFT BREAST   COLONOSCOPY     SEPTOPLASTY     2007    Prior to Admission medications   Medication Sig Start Date End Date Taking? Authorizing Provider  Accu-Chek Softclix Lancets lancets daily. 02/23/23  Yes [provider]  Alogliptin Benzoate (NESINA) 25 MG TABS Take by mouth daily at 6 (six) AM.   Yes [provider]  Alogliptin Benzoate (NESINA) 25 MG TABS  06/20/16  Yes [provider]  amLODipine (NORVASC) 10 MG tablet Take 10 mg by mouth daily.     Yes [provider]  carvedilol (COREG) 25 MG tablet TAKE 1 TABLET BY MOUTH  TWICE DAILY 05/31/11  Yes Nahser, Deloris Ping, MD  Cholecalciferol (VITAMIN D) 1000 UNITS capsule Take 1,000 Units by mouth daily at 12 noon.   Yes [provider]  Cyanocobalamin (B-12) 1000 MCG CAPS Take 1 capsule by mouth daily.     Yes [provider]  empagliflozin (JARDIANCE) 25 MG TABS tablet Take by mouth daily.   Yes [provider]  famotidine (PEPCID) 20 MG tablet Take 20 mg by mouth daily as needed for heartburn or indigestion.   Yes [provider]  glucose blood test strip CHECK GLUCOSES ONCE DAILY EVERY OTHER DAY 04/10/23  Yes [provider]  KLOR-CON M20 20 MEQ tablet Take 20 mEq by mouth 2 (two) times daily. 04/19/23  Yes [provider]  losartan (COZAAR) 100 MG tablet Take 1 tablet (100 mg total) by mouth daily. 11/24/11  Yes Nahser, Deloris Ping, MD  metFORMIN (GLUCOPHAGE) 500 MG tablet Take 500 mg by mouth. Takes 1000 mg BID   Yes [provider]  triamterene-hydrochlorothiazide (MAXZIDE-25) 37.5-25 MG tablet  06/20/08  Yes [provider]  acetaminophen (TYLENOL) 500 MG tablet Take 1,000 mg by mouth every 6 (six) hours as needed.    [provider]  Cetirizine HCl (ZYRTEC PO) Take by mouth as needed.      [provider]  meclizine (ANTIVERT) 25 MG tablet Take 25 mg by mouth 3 (three) times daily as needed.      [provider]  simethicone (MYLICON) 125 MG chewable tablet Chew 125 mg by mouth every 6 (six) hours as needed for flatulence.    [provider]    Current Outpatient Medications  Medication Sig Dispense Refill   Accu-Chek Softclix Lancets lancets daily.     Alogliptin Benzoate (NESINA) 25 MG TABS Take by mouth daily at 6 (six) AM.     Alogliptin Benzoate (NESINA) 25 MG TABS      amLODipine (NORVASC) 10 MG tablet Take 10 mg by mouth daily.       carvedilol (COREG) 25 MG tablet TAKE 1 TABLET BY MOUTH TWICE DAILY 180 tablet 2   Cholecalciferol (VITAMIN D) 1000 UNITS  capsule Take 1,000 Units by mouth daily at 12 noon.     Cyanocobalamin (B-12) 1000 MCG CAPS Take 1 capsule by mouth daily.       empagliflozin (JARDIANCE) 25 MG TABS tablet Take by mouth daily.     famotidine (PEPCID) 20 MG tablet Take 20 mg by mouth daily as needed for heartburn or indigestion.     glucose blood test strip CHECK GLUCOSES ONCE DAILY EVERY OTHER DAY     KLOR-CON M20 20 MEQ tablet Take 20 mEq by mouth 2 (two) times daily.     losartan (COZAAR) 100 MG tablet Take 1 tablet (100 mg total) by mouth daily. 30 tablet 1   metFORMIN (GLUCOPHAGE) 500 MG tablet Take 500 mg by mouth. Takes 1000 mg BID     triamterene-hydrochlorothiazide (MAXZIDE-25) 37.5-25 MG tablet      acetaminophen (TYLENOL) 500 MG tablet Take 1,000 mg by mouth every 6 (six) hours as needed.     Cetirizine HCl (ZYRTEC PO) Take by mouth as needed.       meclizine (ANTIVERT) 25 MG tablet Take 25 mg by mouth 3 (three) times daily as needed.       simethicone (MYLICON) 125 MG chewable tablet Chew 125 mg by mouth every 6 (six) hours as needed for flatulence.     Current Facility-Administered Medications  Medication Dose Route Frequency Provider Last Rate Last Admin   0.9 %  sodium chloride infusion  500 mL Intravenous Once Syvilla Martin V, DO        Allergies as of 06/09/2023 - Review Complete 06/09/2023  Allergen Reaction Noted   Tetracyclines & related Hives 10/28/2010   Doxazosin Other (See Comments) 05/15/2023   Januvia [sitagliptin] Other (See Comments) 05/15/2023   Spironolactone Swelling 05/15/2023    Family History  Problem Relation Age of Onset   Hypertension Mother    Hypertension Father    Colon cancer Neg Hx    Colon polyps Neg Hx    Esophageal cancer Neg Hx    Stomach cancer Neg Hx    Rectal cancer Neg Hx     Social History   Socioeconomic History   Marital status: Married    Spouse name: Not on file   Number of children: Not on file   Years of education: Not on file   Highest education  level: Not on file  Occupational History   Not on file  Tobacco Use   Smoking status: Never   Smokeless tobacco: Never  Vaping Use   Vaping status: Never Used  Substance and Sexual Activity   Alcohol use: No   Drug use: No   Sexual activity: Not on file  Other Topics Concern   Not on file  Social History Narrative   Not on file   Social Drivers of Health  Financial Resource Strain: Not on file  Food Insecurity: Not on file  Transportation Needs: Not on file  Physical Activity: Not on file  Stress: Not on file  Social Connections: Not on file  Intimate Partner Violence: Not on file    Physical Exam: Vital signs in last 24 hours: @BP  133/79   Pulse 70   Temp (!) 97.5 F (36.4 C)   Ht 5\' 10"  (1.778 m)   Wt 216 lb (98 kg)   SpO2 99%   BMI 30.99 kg/m  GEN: NAD EYE: Sclerae anicteric ENT: MMM CV: Non-tachycardic Pulm: CTA b/l GI: Soft, NT/ND NEURO:  Alert & Oriented x 3   Doristine Locks, DO [facility]   06/09/2023 11:35 AM

## 2023-06-09 NOTE — Progress Notes (Signed)
Pt's states no medical or surgical changes since previsit or office visit. 

## 2023-06-13 ENCOUNTER — Telehealth: Payer: Self-pay

## 2023-06-13 LAB — SURGICAL PATHOLOGY

## 2023-06-13 NOTE — Telephone Encounter (Signed)
  Follow up Call-     06/09/2023   11:08 AM  Call back number  Post procedure Call Back phone  # [phone]  Permission to leave phone message Yes     Patient questions:  Do you have a fever, pain , or abdominal swelling? No. Pain Score  0 *  Have you tolerated food without any problems? Yes.    Have you been able to return to your normal activities? Yes.    Do you have any questions about your discharge instructions: Diet   No. Medications  No. Follow up visit  No.  Do you have questions or concerns about your Care? No.  Actions: * If pain score is 4 or above: No action needed, pain <4.

## 2024-03-12 ENCOUNTER — Other Ambulatory Visit (HOSPITAL_BASED_OUTPATIENT_CLINIC_OR_DEPARTMENT_OTHER): Payer: Self-pay | Admitting: Family Medicine

## 2024-03-12 DIAGNOSIS — E782 Mixed hyperlipidemia: Secondary | ICD-10-CM

## 2024-03-25 ENCOUNTER — Ambulatory Visit (HOSPITAL_BASED_OUTPATIENT_CLINIC_OR_DEPARTMENT_OTHER)
Admission: RE | Admit: 2024-03-25 | Discharge: 2024-03-25 | Disposition: A | Discharge status: Home / Self Care | Payer: Self-pay | Source: Ambulatory Visit | Attending: Family Medicine | Admitting: Family Medicine

## 2024-03-25 DIAGNOSIS — E782 Mixed hyperlipidemia: Secondary | ICD-10-CM | POA: Insufficient documentation

## 2024-04-11 ENCOUNTER — Other Ambulatory Visit: Payer: Self-pay | Admitting: Family Medicine

## 2024-04-11 DIAGNOSIS — E279 Disorder of adrenal gland, unspecified: Secondary | ICD-10-CM

## 2024-04-19 ENCOUNTER — Ambulatory Visit
Admission: RE | Admit: 2024-04-19 | Discharge: 2024-04-19 | Disposition: A | Discharge status: Home / Self Care | Source: Ambulatory Visit | Attending: Family Medicine | Admitting: Family Medicine

## 2024-04-19 DIAGNOSIS — E279 Disorder of adrenal gland, unspecified: Secondary | ICD-10-CM

## 2024-04-19 MED ORDER — IOPAMIDOL (ISOVUE-300) INJECTION 61%
60.0000 mL | Freq: Once | INTRAVENOUS | Status: AC | PRN
  Administered 2024-04-19: 60 mL via INTRAVENOUS

## 2024-07-08 ENCOUNTER — Ambulatory Visit: Attending: Cardiology | Admitting: Cardiology

## 2024-07-08 ENCOUNTER — Encounter: Payer: Self-pay | Admitting: Cardiology

## 2024-07-08 VITALS — BP 130/70 | HR 67 | Resp 16 | Ht 70.0 in | Wt 216.7 lb

## 2024-07-08 DIAGNOSIS — R0609 Other forms of dyspnea: Secondary | ICD-10-CM | POA: Insufficient documentation

## 2024-07-08 DIAGNOSIS — I25118 Atherosclerotic heart disease of native coronary artery with other forms of angina pectoris: Secondary | ICD-10-CM | POA: Diagnosis not present

## 2024-07-08 DIAGNOSIS — R600 Localized edema: Secondary | ICD-10-CM | POA: Insufficient documentation

## 2024-07-08 DIAGNOSIS — I10 Essential (primary) hypertension: Secondary | ICD-10-CM

## 2024-07-08 DIAGNOSIS — E119 Type 2 diabetes mellitus without complications: Secondary | ICD-10-CM | POA: Diagnosis not present

## 2024-07-08 DIAGNOSIS — I2510 Atherosclerotic heart disease of native coronary artery without angina pectoris: Secondary | ICD-10-CM | POA: Insufficient documentation

## 2024-07-08 DIAGNOSIS — R0989 Other specified symptoms and signs involving the circulatory and respiratory systems: Secondary | ICD-10-CM | POA: Diagnosis not present

## 2024-07-08 LAB — PRO B NATRIURETIC PEPTIDE: NT-Pro BNP: 53 pg/mL (ref 0–376)

## 2024-07-08 MED ORDER — ASPIRIN 81 MG PO TBEC: 81.0000 mg | DELAYED_RELEASE_TABLET | Freq: Every day | ORAL | Status: AC

## 2024-07-08 NOTE — Patient Instructions (Signed)
 Medication Instructions:  Start: Aspirin  81 mg, one tablet, once daily  *If you need a refill on your cardiac medications before your next appointment, please call your pharmacy*  Lab Work: Today: ProBNP  If you have labs (blood work) drawn today and your tests are completely normal, you will receive your results only by: MyChart Message (if you have MyChart) OR A paper copy in the mail If you have any lab test that is abnormal or we need to change your treatment, we will call you to review the results.  Testing/Procedures: Your physician has requested that you have an echocardiogram. Echocardiography is a painless test that uses sound waves to create images of your heart. It provides your doctor with information about the size and shape of your heart and how well your hearts chambers and valves are working. This procedure takes approximately one hour. There are no restrictions for this procedure. Please do NOT wear cologne, perfume, aftershave, or lotions (deodorant is allowed). Please arrive 15 minutes prior to your appointment time.  Please note: We ask at that you not bring children with you during ultrasound (echo/ vascular) testing. Due to room size and safety concerns, children are not allowed in the ultrasound rooms during exams. Our front office staff cannot provide observation of children in our lobby area while testing is being conducted. An adult accompanying a patient to their appointment will only be allowed in the ultrasound room at the discretion of the ultrasound technician under special circumstances. We apologize for any inconvenience.  *-*-*-*-*-*-*-*-*-*-*-*-*-*   Your physician has requested that you have an ankle brachial index (ABI). During this test an ultrasound and blood pressure cuff are used to evaluate the arteries that supply the arms and legs with blood. Allow thirty minutes for this exam. There are no restrictions or special instructions. This will take place  at [address], 4th floor   Please note: We ask at that you not bring children with you during ultrasound (echo/ vascular) testing. Due to room size and safety concerns, children are not allowed in the ultrasound rooms during exams. Our front office staff cannot provide observation of children in our lobby area while testing is being conducted. An adult accompanying a patient to their appointment will only be allowed in the ultrasound room at the discretion of the ultrasound technician under special circumstances. We apologize for any inconvenience.  *-*-*-*-*-*-*-*-*-*-*-*-*-*    Please report to Radiology at the [facility] Main Entrance 30 minutes early for your test.  [address] [place], [place] [address]                         OR   Please report to Radiology at [facility] Main Entrance, medical mall, 30 mins prior to your test.  [address]  [place], [place]  How to Prepare for Your Cardiac PET/CT Stress Test:  Nothing to eat or drink, except water, 3 hours prior to arrival time.  NO caffeine/decaffeinated products, or chocolate 12 hours prior to arrival. (Please note decaffeinated beverages (teas/coffees) still contain caffeine).  If you have caffeine within 12 hours prior, the test will need to be rescheduled.  Medication instructions: Do not take erectile dysfunction medications for 72 hours prior to test (sildenafil, tadalafil) Do not take nitrates (isosorbide mononitrate, Ranexa) the day before or day of test Do not take tamsulosin the day before or morning of test Hold theophylline containing medications for  12 hours. Hold Dipyridamole 48 hours prior to the test.  Diabetic Preparation: If able to eat breakfast prior to 3 hour fasting, you may take all medications, including your insulin. Do not worry if you miss your breakfast dose of insulin - start at your next meal. If you do not eat prior to 3 hour fast-Hold all  diabetes (oral and insulin) medications. Patients who wear a continuous glucose monitor MUST remove the device prior to scanning.  You may take your remaining medications with water.  NO perfume, cologne or lotion on chest or abdomen area. FEMALES - Please avoid wearing dresses to this appointment.  Total time is 1 to 2 hours; you may want to bring reading material for the waiting time.  IF YOU THINK YOU MAY BE PREGNANT, OR ARE NURSING PLEASE INFORM THE TECHNOLOGIST.  In preparation for your appointment, medication and supplies will be purchased.  Appointment availability is limited, so if you need to cancel or reschedule, please call the Radiology Department Scheduler at [phone] 24 hours in advance to avoid a cancellation fee of $100.00  What to Expect When you Arrive:  Once you arrive and check in for your appointment, you will be taken to a preparation room within the Radiology Department.  A technologist or Nurse will obtain your medical history, verify that you are correctly prepped for the exam, and explain the procedure.  Afterwards, an IV will be started in your arm and electrodes will be placed on your skin for EKG monitoring during the stress portion of the exam. Then you will be escorted to the PET/CT scanner.  There, staff will get you positioned on the scanner and obtain a blood pressure and EKG.  During the exam, you will continue to be connected to the EKG and blood pressure machines.  A small, safe amount of a radioactive tracer will be injected in your IV to obtain a series of pictures of your heart along with an injection of a stress agent.    After your Exam:  It is recommended that you eat a meal and drink a caffeinated beverage to counter act any effects of the stress agent.  Drink plenty of fluids for the remainder of the day and urinate frequently for the first couple of hours after the exam.  Your doctor will inform you of your test results within 7-10 business  days.  For more information and frequently asked questions, please visit our website: [email or web address]  For questions about your test or how to prepare for your test, please call: Cardiac Imaging Nurse Navigators Office: [phone]    Follow-Up: At [facility], you and your health needs are our priority.  As part of our continuing mission to provide you with exceptional heart care, our providers are all part of one team.  This team includes your primary Cardiologist (physician) and Advanced Practice Providers or APPs (Physician Assistants and Nurse Practitioners) who all work together to provide you with the care you need, when you need it.  Your next appointment:   1 year(s)  Provider:   Newman JINNY Lawrence, MD

## 2024-07-08 NOTE — Progress Notes (Signed)
 " Cardiology Office Note:  .   Date:  07/08/2024  ID:  Cole Travis, DOB 04-20-1956, MRN [id number] PCP: Windy Coy, MD  [facility] Providers Cardiologist:  Newman Lawrence, MD PCP: Windy Coy, MD  Chief Complaint  Patient presents with   Coronary Artery Disease     Cole Travis is a 69 y.o. male with hypertension, hyperlipidemia, type 2 diabetes mellitus, coronary artery disease  Discussed the use of AI scribe software for clinical note transcription with the patient, who gave verbal consent to proceed.  History of Present Illness Cole Travis is a 69 year old male with coronary artery disease and diabetes who presents with shortness of breath.  He has progressive shortness of breath with physical activity such as yard work since 2019. He needs to rest after two to three hours of activity and has no chest pain.  A prior CT showed elevated coronary artery calcium . He takes amlodipine 10 mg daily, carvedilol 25 mg twice daily, losartan  100 mg daily, triamterene HCTZ, metformin 500 mg twice daily, Jardiance 25 mg daily, saxagliptin, and rosuvastatin 40 mg daily.  He has diabetes with recent glucose values 108 to 153 mg/dL and a hemoglobin J8r of 6.8% in July.  He has chronic kidney disease with creatinine 1.8 and GFR 40, monitored regularly.  He has intermittent leg pain with long-distance walking, which he associated with rosuvastatin. After restarting rosuvastatin he has not tested this with longer walks, so persistence of symptoms is unclear.  He smokes one to three cigars per week and has done so for over 20 years.      Vitals:   07/08/24 1312  BP: 130/70  Pulse: 67  Resp: 16  SpO2: 97%      Review of Systems  Cardiovascular:  Positive for dyspnea on exertion. Negative for chest pain, leg swelling, palpitations and syncope.        Studies Reviewed: Cole Travis        EKG 07/08/2024: Normal sinus rhythm Normal ECG No previous ECGs  available  CT cardiac scoring 03/2024: Left main: 23  Left anterior descending artery: 508  Left circumflex artery: 309  Right coronary artery: 132  Total score: 972 (96th percentile)     Labs 03/2024: TG 101, HDL 38, LDL 10 Lp(a) 97 Hb 14 Cr 1.83, eGFR 40  12/2023: HbA1C 7.0%   Physical Exam Vitals and nursing note reviewed.  Constitutional:      General: He is not in acute distress. Neck:     Vascular: No JVD.  Cardiovascular:     Rate and Rhythm: Normal rate and regular rhythm.     Pulses:          Dorsalis pedis pulses are 1+ on the right side and 1+ on the left side.       Posterior tibial pulses are 0 on the right side and 0 on the left side.     Heart sounds: Normal heart sounds. No murmur heard. Pulmonary:     Effort: Pulmonary effort is normal.     Breath sounds: Normal breath sounds. No wheezing or rales.  Musculoskeletal:     Right lower leg: No edema.     Left lower leg: No edema.      VISIT DIAGNOSES:   ICD-10-CM   1. Primary hypertension  I10 EKG 12-Lead    2. Coronary artery disease involving native coronary artery of native heart with other form of angina pectoris  I25.118 ECHOCARDIOGRAM COMPLETE  NM PET CT CARDIAC PERFUSION MULTI W/ABSOLUTE BLOODFLOW    Cardiac Stress Test: Informed Consent Details: Physician/Practitioner Attestation; Transcribe to consent form and obtain patient signature    3. Type 2 diabetes mellitus without complication, without long-term current use of insulin (HCC)  E11.9     4. Abnormal pulse  R09.89 VAS US  ABI WITH/WO TBI    5. Leg edema  R60.0 ECHOCARDIOGRAM COMPLETE    Pro b natriuretic peptide (BNP)    6. Exertional dyspnea  R06.09 NM PET CT CARDIAC PERFUSION MULTI W/ABSOLUTE BLOODFLOW       Cole Travis is a 69 y.o. male with hypertension, hyperlipidemia, type 2 diabetes mellitus, coronary artery disease  Assessment & Plan Coronary artery disease: Significant coronary artery calcification on CT.  Shortness of breath on exertion, cannot exclude angina equivalent. He laso has mild leg edema, which could be due to fluid retention or due to side effect of amlodipine. - Ordered PET CT stress test for myocardial perfusion. - Ordered echocardiogram for cardiac function evaluation. - Checked ProBNP for heart failure assessment. - Recommended daily aspirin  81 mg, given that benefits outweigh risks in the setting of coronary artery disease with high calcification, and risk factors for CAD as above. - Currently, his LDL is extremely well-controlled at 10 on rosuvastatin 40 mg daily.  While Dr. Magnus notes mention about Zetia, I do not see that he is currently taking this medication.  Regardless, he is on excellent medical therapy.  His LDL particle is higher than ideal, and it is possible that PCSK9 inhibitor therapy may be in his future to reduce his future cardiac risk.  However, it would be difficult to get PCSK9 inhibitor therapy approved when his LDL is as low as 10.  He does have some myalgias from time to time.  I have encouraged him to walk more, which would give us  a true idea with the this is due to PAD, or true statin intolerance.  If he does indeed develop more statin intolerance in future, we could consider reducing statin dose and and justify use of PCSK9 inhibitor at that time.  Given his risk factors I would also check ABI as screening for PAD.  Primary hypertension: Hypertension well-controlled with amlodipine, carvedilol, losartan , and triamterene/HCTZ. - Continue current antihypertensive regimen.  Type 2 diabetes mellitus: Fluctuating blood glucose. Hemoglobin A1c at 7% in July, indicating suboptimal control. - Continue current diabetes medications. - Monitor blood glucose levels regularly.    Informed Consent   Shared Decision Making/Informed Consent The risks [chest pain, shortness of breath, cardiac arrhythmias, dizziness, blood pressure fluctuations, myocardial  infarction, stroke/transient ischemic attack, nausea, vomiting, allergic reaction, radiation exposure, metallic taste sensation and life-threatening complications (estimated to be 1 in 10,000)], benefits (risk stratification, diagnosing coronary artery disease, treatment guidance) and alternatives of a cardiac PET stress test were discussed in detail with Mr. Romney and he agrees to proceed.       Meds ordered this encounter  Medications   aspirin  EC 81 MG tablet    Sig: Take 1 tablet (81 mg total) by mouth daily. Swallow whole.     F/u in 1 year  Signed, Newman JINNY Lawrence, MD  "

## 2024-07-10 ENCOUNTER — Ambulatory Visit: Payer: Self-pay | Admitting: Cardiology

## 2024-08-07 ENCOUNTER — Ambulatory Visit (HOSPITAL_COMMUNITY)

## 2024-08-14 ENCOUNTER — Other Ambulatory Visit (HOSPITAL_COMMUNITY)

## 2024-09-04 ENCOUNTER — Ambulatory Visit (HOSPITAL_COMMUNITY)
